# Patient Record
Sex: Male | Born: 1990 | Race: Black or African American | Hispanic: No | Marital: Married | Smoking: Never smoker
Health system: Southern US, Community
[De-identification: ages and names within clinical notes are randomized; demographics above are authoritative.]

## PROBLEM LIST (undated history)

## (undated) DIAGNOSIS — R569 Unspecified convulsions: Secondary | ICD-10-CM

---
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
## 2009-06-24 ENCOUNTER — Emergency Department (HOSPITAL_BASED_OUTPATIENT_CLINIC_OR_DEPARTMENT_OTHER): Admission: EM | Admit: 2009-06-24 | Discharge: 2009-06-25 | Payer: Self-pay | Admitting: Emergency Medicine

## 2010-03-18 ENCOUNTER — Emergency Department (HOSPITAL_BASED_OUTPATIENT_CLINIC_OR_DEPARTMENT_OTHER)
Admission: EM | Admit: 2010-03-18 | Discharge: 2010-03-19 | Disposition: A | Discharge status: Home / Self Care | Payer: Self-pay | Attending: Emergency Medicine | Admitting: Emergency Medicine

## 2010-03-18 DIAGNOSIS — R112 Nausea with vomiting, unspecified: Secondary | ICD-10-CM | POA: Insufficient documentation

## 2010-03-18 DIAGNOSIS — K5289 Other specified noninfective gastroenteritis and colitis: Secondary | ICD-10-CM | POA: Insufficient documentation

## 2010-05-03 LAB — URINALYSIS, ROUTINE W REFLEX MICROSCOPIC
Bilirubin Urine: NEGATIVE
Glucose, UA: NEGATIVE mg/dL
Hgb urine dipstick: NEGATIVE
Ketones, ur: 15 mg/dL — AB
Nitrite: NEGATIVE
Protein, ur: NEGATIVE mg/dL
Specific Gravity, Urine: 1.028 (ref 1.005–1.030)
Urobilinogen, UA: 1 mg/dL (ref 0.0–1.0)
pH: 7.5 (ref 5.0–8.0)

## 2010-05-03 LAB — CBC
HCT: 47.1 % (ref 39.0–52.0)
Hemoglobin: 15.7 g/dL (ref 13.0–17.0)
MCHC: 33.4 g/dL (ref 30.0–36.0)
MCV: 84.1 fL (ref 78.0–100.0)
Platelets: 231 10*3/uL (ref 150–400)
RBC: 5.6 MIL/uL (ref 4.22–5.81)
RDW: 12.8 % (ref 11.5–15.5)
WBC: 9.2 10*3/uL (ref 4.0–10.5)

## 2010-05-03 LAB — URINE CULTURE
Colony Count: NO GROWTH
Culture: NO GROWTH

## 2010-05-03 LAB — DIFFERENTIAL
Basophils Absolute: 0 10*3/uL (ref 0.0–0.1)
Basophils Relative: 0 % (ref 0–1)
Eosinophils Absolute: 0 10*3/uL (ref 0.0–0.7)
Eosinophils Relative: 0 % (ref 0–5)
Lymphocytes Relative: 5 % — ABNORMAL LOW (ref 12–46)
Lymphs Abs: 0.5 10*3/uL — ABNORMAL LOW (ref 0.7–4.0)
Monocytes Absolute: 0.6 10*3/uL (ref 0.1–1.0)
Monocytes Relative: 6 % (ref 3–12)
Neutro Abs: 8.1 10*3/uL — ABNORMAL HIGH (ref 1.7–7.7)
Neutrophils Relative %: 88 % — ABNORMAL HIGH (ref 43–77)

## 2010-05-03 LAB — GC/CHLAMYDIA PROBE AMP, GENITAL
Chlamydia, DNA Probe: NEGATIVE
GC Probe Amp, Genital: NEGATIVE

## 2010-05-03 LAB — URINE MICROSCOPIC-ADD ON

## 2010-05-03 LAB — COMPREHENSIVE METABOLIC PANEL
ALT: 14 U/L (ref 0–53)
AST: 21 U/L (ref 0–37)
Albumin: 4.3 g/dL (ref 3.5–5.2)
Alkaline Phosphatase: 91 U/L (ref 39–117)
BUN: 17 mg/dL (ref 6–23)
CO2: 27 mEq/L (ref 19–32)
Calcium: 9.8 mg/dL (ref 8.4–10.5)
Chloride: 105 mEq/L (ref 96–112)
Creatinine, Ser: 0.9 mg/dL (ref 0.4–1.5)
GFR calc Af Amer: >60 mL/min (ref >60)
GFR calc non Af Amer: >60 mL/min (ref >60)
Glucose, Bld: 126 mg/dL — ABNORMAL HIGH (ref 70–99)
Potassium: 3.6 mEq/L (ref 3.5–5.1)
Sodium: 145 mEq/L (ref 135–145)
Total Bilirubin: 0.6 mg/dL (ref 0.3–1.2)
Total Protein: 7.8 g/dL (ref 6.0–8.3)

## 2010-05-03 LAB — RAPID STREP SCREEN (MED CTR MEBANE ONLY): Streptococcus, Group A Screen (Direct): NEGATIVE

## 2011-09-16 ENCOUNTER — Encounter (HOSPITAL_BASED_OUTPATIENT_CLINIC_OR_DEPARTMENT_OTHER): Payer: Self-pay

## 2011-09-16 ENCOUNTER — Emergency Department (HOSPITAL_BASED_OUTPATIENT_CLINIC_OR_DEPARTMENT_OTHER): Payer: Self-pay

## 2011-09-16 ENCOUNTER — Emergency Department (HOSPITAL_BASED_OUTPATIENT_CLINIC_OR_DEPARTMENT_OTHER)
Admission: EM | Admit: 2011-09-16 | Discharge: 2011-09-17 | Disposition: A | Discharge status: Home / Self Care | Payer: Self-pay | Attending: Emergency Medicine | Admitting: Emergency Medicine

## 2011-09-16 ENCOUNTER — Emergency Department (HOSPITAL_BASED_OUTPATIENT_CLINIC_OR_DEPARTMENT_OTHER)
Admission: EM | Admit: 2011-09-16 | Discharge: 2011-09-16 | Disposition: A | Discharge status: Home / Self Care | Payer: Self-pay | Attending: Emergency Medicine | Admitting: Emergency Medicine

## 2011-09-16 ENCOUNTER — Encounter (HOSPITAL_BASED_OUTPATIENT_CLINIC_OR_DEPARTMENT_OTHER): Payer: Self-pay | Admitting: Emergency Medicine

## 2011-09-16 DIAGNOSIS — K2970 Gastritis, unspecified, without bleeding: Secondary | ICD-10-CM | POA: Insufficient documentation

## 2011-09-16 DIAGNOSIS — R10819 Abdominal tenderness, unspecified site: Secondary | ICD-10-CM | POA: Insufficient documentation

## 2011-09-16 DIAGNOSIS — R109 Unspecified abdominal pain: Secondary | ICD-10-CM | POA: Insufficient documentation

## 2011-09-16 DIAGNOSIS — K2990 Gastroduodenitis, unspecified, without bleeding: Secondary | ICD-10-CM | POA: Insufficient documentation

## 2011-09-16 HISTORY — DX: Unspecified convulsions: R56.9

## 2011-09-16 LAB — URINALYSIS, ROUTINE W REFLEX MICROSCOPIC
Bilirubin Urine: NEGATIVE
Glucose, UA: NEGATIVE mg/dL
Ketones, ur: NEGATIVE mg/dL
Leukocytes, UA: NEGATIVE
Nitrite: NEGATIVE
Protein, ur: NEGATIVE mg/dL
Specific Gravity, Urine: 1.028 (ref 1.005–1.030)
Urobilinogen, UA: 0.2 mg/dL (ref 0.0–1.0)
pH: 6 (ref 5.0–8.0)

## 2011-09-16 LAB — COMPREHENSIVE METABOLIC PANEL
ALT: 40 U/L (ref 0–53)
AST: 25 U/L (ref 0–37)
Albumin: 4.1 g/dL (ref 3.5–5.2)
BUN: 14 mg/dL (ref 6–23)
Calcium: 9.7 mg/dL (ref 8.4–10.5)
Chloride: 104 mEq/L (ref 96–112)
Creatinine, Ser: 0.9 mg/dL (ref 0.50–1.35)
GFR calc Af Amer: >90 mL/min (ref >90)
Glucose, Bld: 125 mg/dL — ABNORMAL HIGH (ref 70–99)
Potassium: 4.4 mEq/L (ref 3.5–5.1)
Sodium: 139 mEq/L (ref 135–145)
Total Bilirubin: 0.5 mg/dL (ref 0.3–1.2)
Total Protein: 7.2 g/dL (ref 6.0–8.3)

## 2011-09-16 LAB — CBC WITH DIFFERENTIAL/PLATELET
Basophils Relative: 0 % (ref 0–1)
Eosinophils Absolute: 0.1 10*3/uL (ref 0.0–0.7)
Hemoglobin: 14.6 g/dL (ref 13.0–17.0)
Lymphocytes Relative: 18 % (ref 12–46)
Lymphs Abs: 1.1 10*3/uL (ref 0.7–4.0)
MCH: 27.8 pg (ref 26.0–34.0)
MCHC: 34 g/dL (ref 30.0–36.0)
MCV: 81.7 fL (ref 78.0–100.0)
Monocytes Absolute: 0.3 10*3/uL (ref 0.1–1.0)
Monocytes Relative: 4 % (ref 3–12)
Neutro Abs: 4.7 10*3/uL (ref 1.7–7.7)
Neutrophils Relative %: 76 % (ref 43–77)
Platelets: 204 10*3/uL (ref 150–400)
RBC: 5.26 MIL/uL (ref 4.22–5.81)
RDW: 13.3 % (ref 11.5–15.5)
WBC: 6.2 10*3/uL (ref 4.0–10.5)

## 2011-09-16 LAB — LIPASE, BLOOD: Lipase: 27 U/L (ref 11–59)

## 2011-09-16 MED ORDER — ONDANSETRON HCL 4 MG/2ML IJ SOLN
4.0000 mg | Freq: Once | INTRAMUSCULAR | Status: AC
  Administered 2011-09-16: 4 mg via INTRAVENOUS
  Filled 2011-09-16: qty 2

## 2011-09-16 MED ORDER — RANITIDINE HCL 150 MG PO TABS: 150.0000 mg | ORAL_TABLET | Freq: Two times a day (BID) | ORAL | Status: DC

## 2011-09-16 MED ORDER — IOHEXOL 300 MG/ML  SOLN
100.0000 mL | Freq: Once | INTRAMUSCULAR | Status: AC | PRN
  Administered 2011-09-16: 100 mL via INTRAVENOUS

## 2011-09-16 MED ORDER — MORPHINE SULFATE 4 MG/ML IJ SOLN
4.0000 mg | Freq: Once | INTRAMUSCULAR | Status: AC
  Administered 2011-09-16: 4 mg via INTRAVENOUS
  Filled 2011-09-16: qty 1

## 2011-09-16 MED ORDER — GI COCKTAIL ~~LOC~~
30.0000 mL | Freq: Once | ORAL | Status: AC
  Administered 2011-09-16: 30 mL via ORAL
  Filled 2011-09-16: qty 30

## 2011-09-16 MED ORDER — IOHEXOL 300 MG/ML  SOLN
40.0000 mL | Freq: Once | INTRAMUSCULAR | Status: AC | PRN
  Administered 2011-09-16: 40 mL via ORAL

## 2011-09-16 MED ORDER — ALUM & MAG HYDROXIDE-SIMETH 200-200-20 MG/5ML PO SUSP: ORAL | Status: DC

## 2011-09-16 NOTE — ED Notes (Signed)
Pt c/o LUQ & LT flank pain x 2 days

## 2011-09-16 NOTE — ED Provider Notes (Signed)
History     CSN: [id number]  Arrival date & time 09/16/11  2035   First MD Initiated Contact with Patient 09/16/11 2050      Chief Complaint  Patient presents with  . Abdominal Pain    (Consider location/radiation/quality/duration/timing/severity/associated sxs/prior treatment) HPI Comments: Pt states that he was seen earlier today, but the symptoms are getting worse:pt states that he is now having vomiting and diffuse pain on the entire left abdomen  Patient is a 21 y.o. male presenting with abdominal pain. The history is provided by the patient. No language interpreter was used.  Abdominal Pain The primary symptoms of the illness include abdominal pain, nausea and vomiting. The primary symptoms of the illness do not include fever or diarrhea. The current episode started 2 days ago. The onset of the illness was gradual. The problem has not changed since onset. The patient states that she believes she is currently not pregnant. The patient has not had a change in bowel habit.    Past Medical History  Diagnosis Date  . Seizures     History reviewed. No pertinent past surgical history.  No family history on file.  History  Substance Use Topics  . Smoking status: Never Smoker   . Smokeless tobacco: Not on file  . Alcohol Use: Yes     social      Review of Systems  Constitutional: Negative for fever.  Respiratory: Negative.   Cardiovascular: Negative.   Gastrointestinal: Positive for nausea, vomiting and abdominal pain. Negative for diarrhea.  Neurological: Negative.     Allergies  Lamictal  Home Medications   Current Outpatient Rx  Name Route Sig Dispense Refill  . ALUM & MAG HYDROXIDE-SIMETH 200-200-20 MG/5ML PO SUSP  Take 2 teaspoons of Mylanta after meals and at bedtime.  Buy over-the-counter. 355 mL 0  . RANITIDINE HCL 150 MG PO TABS Oral Take 1 tablet (150 mg total) by mouth 2 (two) times daily. 30 tablet 0    BP 133/87  Pulse 65  Temp 97.8 F (36.6 C)  (Oral)  Resp 14  Ht 5\' 5"  (1.651 m)  Wt 140 lb (63.504 kg)  BMI 23.30 kg/m2  SpO2 100%  Physical Exam  Nursing note and vitals reviewed. Constitutional: He appears well-developed and well-nourished.  HENT:  Head: Normocephalic and atraumatic.  Eyes: Conjunctivae and EOM are normal.  Neck: Neck supple.  Cardiovascular: Normal rate and regular rhythm.   Pulmonary/Chest: Effort normal and breath sounds normal.  Abdominal: Soft. Bowel sounds are normal. There is tenderness in the left upper quadrant and left lower quadrant.  Musculoskeletal: Normal range of motion.  Neurological: He is alert.  Skin: Skin is warm and dry.    ED Course  Procedures (including critical care time)  Labs Reviewed - No data to display Ct Abdomen Pelvis W Contrast  09/16/2011  *RADIOLOGY REPORT*  Clinical Data: Left-sided abdominal pain and vomiting.  CT ABDOMEN AND PELVIS WITH CONTRAST  Technique:  Multidetector CT imaging of the abdomen and pelvis was performed following the standard protocol during bolus administration of intravenous contrast.  Contrast: OMNIPAQUE IOHEXOL 300 MG/ML  SOLN  Comparison: None.  Findings: The lung bases are clear.  The liver, spleen, gallbladder, pancreas, adrenal glands, kidneys, abdominal aorta, and retroperitoneal lymph nodes are unremarkable.  The stomach, small bowel, and colon are not abnormally distended and no obvious wall thickening is appreciated.  No free air or free fluid in the abdomen.  Pelvis:  Prostate gland is not enlarged.  Bladder wall is not thickened.  No free or loculated pelvic fluid collections. Appendix is not identified but no focal right lower quadrant inflammatory changes are present.  No significant pelvic lymphadenopathy.  Normal alignment of the lumbar vertebrae.  IMPRESSION: No acute process demonstrated in the abdomen or pelvis.  Original Report Authenticated By: Marlon Pel, M.D.   Dg Abd Acute W/chest  09/16/2011  *RADIOLOGY REPORT*   Clinical Data: Abdominal pain and flank pain  ACUTE ABDOMEN SERIES (ABDOMEN 2 VIEW & CHEST 1 VIEW)  Comparison: None  Findings: The heart size and mediastinal contours are within normal limits.  Both lungs are clear.  The visualized skeletal structures are unremarkable.  The bowel gas pattern appears nonobstructed.  There are no dilated loops of small bowel or air-fluid levels identified.  No abnormal abdominal or pelvic calcifications identified.  IMPRESSION:  1.  Normal exam.  Original Report Authenticated By: Rosealee Albee, M.D.     1. Gastritis       MDM  Pt is okay to follow up with gi:pt is comfortable at this time        Teressa Lower, NP 09/16/11 2351

## 2011-09-16 NOTE — ED Provider Notes (Signed)
History     CSN: [id number]  Arrival date & time 09/16/11  0825   First MD Initiated Contact with Patient 09/16/11 0848      Chief Complaint  Patient presents with  . Abdominal Pain  . Flank Pain    (Consider location/radiation/quality/duration/timing/severity/associated sxs/prior treatment) HPI Comments: The patient is a 21 year old man who says that for the past few nights he said pain in his abdomen. The pain is located to the left upper abdominal quadrant. It seems to have gotten worse, and he was up all last night with pain. He denies vomiting or diarrhea. There's been no dysuria. He denies recent injury. He was up all night with the pain, but took no medication.  Patient is a 21 y.o. male presenting with abdominal pain and flank pain.  Abdominal Pain The primary symptoms of the illness include abdominal pain and diarrhea. The primary symptoms of the illness do not include nausea, vomiting or dysuria. The current episode started 2 days ago. The onset of the illness was gradual. The problem has been gradually worsening.  The abdominal pain began 2 days ago. The pain came on gradually. The abdominal pain has been gradually worsening since its onset. The abdominal pain is located in the LUQ. The abdominal pain does not radiate. The severity of the abdominal pain is 8/10. The abdominal pain is relieved by nothing. Exacerbated by: Nothing.  Associated with: Nothing. The patient has not had a change in bowel habit. Symptoms associated with the illness do not include chills, constipation, hematuria, frequency or back pain.  Flank Pain Associated symptoms include abdominal pain.    Past Medical History  Diagnosis Date  . Seizures     History reviewed. No pertinent past surgical history.  No family history on file.  History  Substance Use Topics  . Smoking status: Never Smoker   . Smokeless tobacco: Not on file  . Alcohol Use: Yes     social      Review of Systems    Constitutional: Negative for chills.  HENT: Negative.   Eyes: Negative.   Respiratory: Negative.   Cardiovascular: Negative.   Gastrointestinal: Positive for abdominal pain and diarrhea. Negative for nausea, vomiting and constipation.  Genitourinary: Positive for flank pain. Negative for dysuria, frequency and hematuria.  Musculoskeletal: Negative.  Negative for back pain.  Skin: Negative.   Neurological: Negative.   Psychiatric/Behavioral: Negative.     Allergies  Lamictal  Home Medications  No current outpatient prescriptions on file.  Pulse 65  Temp 97.7 F (36.5 C)  Resp 16  Ht 5\' 5"  (1.651 m)  Wt 140 lb (63.504 kg)  BMI 23.30 kg/m2  SpO2 100%  Physical Exam  Nursing note and vitals reviewed. Constitutional: He is oriented to person, place, and time. He appears well-developed and well-nourished. No distress.  HENT:  Head: Normocephalic and atraumatic.  Right Ear: External ear normal.  Left Ear: External ear normal.  Mouth/Throat: Oropharynx is clear and moist.  Neck: Normal range of motion. No tracheal deviation present.  Cardiovascular: Normal rate, regular rhythm and normal heart sounds.   Pulmonary/Chest: Effort normal and breath sounds normal.  Abdominal: Soft. Bowel sounds are normal.       He localizes pain to the left upper abdomen.  There is mild-moderate tenderness there.  Musculoskeletal: Normal range of motion. He exhibits no tenderness.  Neurological: He is alert and oriented to person, place, and time.       No sensory or motor deficit.  Skin: Skin is warm and dry.  Psychiatric: He has a normal mood and affect. His behavior is normal.    ED Course  Procedures (including critical care time)   Labs Reviewed  URINALYSIS, ROUTINE W REFLEX MICROSCOPIC  COMPREHENSIVE METABOLIC PANEL  LIPASE, BLOOD  URINALYSIS, ROUTINE W REFLEX MICROSCOPIC  CBC WITH DIFFERENTIAL   8:58 AM Patient was seen and had physical examination. Lab tests were ordered. A  GI cocktail was ordered.  10:16 AM Results for orders placed during the hospital encounter of 09/16/11  URINALYSIS, ROUTINE W REFLEX MICROSCOPIC      Component Value Range   Color, Urine YELLOW  YELLOW   APPearance CLEAR  CLEAR   Specific Gravity, Urine 1.028  1.005 - 1.030   pH 6.0  5.0 - 8.0   Glucose, UA NEGATIVE  NEGATIVE mg/dL   Hgb urine dipstick NEGATIVE  NEGATIVE   Bilirubin Urine NEGATIVE  NEGATIVE   Ketones, ur NEGATIVE  NEGATIVE mg/dL   Protein, ur NEGATIVE  NEGATIVE mg/dL   Urobilinogen, UA 0.2  0.0 - 1.0 mg/dL   Nitrite NEGATIVE  NEGATIVE   Leukocytes, UA NEGATIVE  NEGATIVE  COMPREHENSIVE METABOLIC PANEL      Component Value Range   Sodium 139  135 - 145 mEq/L   Potassium 4.4  3.5 - 5.1 mEq/L   Chloride 104  96 - 112 mEq/L   CO2 29  19 - 32 mEq/L   Glucose, Bld 125 (*) 70 - 99 mg/dL   BUN 14  6 - 23 mg/dL   Creatinine, Ser 8.29  0.50 - 1.35 mg/dL   Calcium 9.7  8.4 - 56.2 mg/dL   Total Protein 7.2  6.0 - 8.3 g/dL   Albumin 4.1  3.5 - 5.2 g/dL   AST 25  0 - 37 U/L   ALT 40  0 - 53 U/L   Alkaline Phosphatase 57  39 - 117 U/L   Total Bilirubin 0.5  0.3 - 1.2 mg/dL   GFR calc non Af Amer >90  >90 mL/min   GFR calc Af Amer >90  >90 mL/min  LIPASE, BLOOD      Component Value Range   Lipase 27  11 - 59 U/L  CBC WITH DIFFERENTIAL      Component Value Range   WBC 6.2  4.0 - 10.5 K/uL   RBC 5.26  4.22 - 5.81 MIL/uL   Hemoglobin 14.6  13.0 - 17.0 g/dL   HCT 13.0  86.5 - 78.4 %   MCV 81.7  78.0 - 100.0 fL   MCH 27.8  26.0 - 34.0 pg   MCHC 34.0  30.0 - 36.0 g/dL   RDW 69.6  29.5 - 28.4 %   Platelets 204  150 - 400 K/uL   Neutrophils Relative 76  43 - 77 %   Neutro Abs 4.7  1.7 - 7.7 K/uL   Lymphocytes Relative 18  12 - 46 %   Lymphs Abs 1.1  0.7 - 4.0 K/uL   Monocytes Relative 4  3 - 12 %   Monocytes Absolute 0.3  0.1 - 1.0 K/uL   Eosinophils Relative 1  0 - 5 %   Eosinophils Absolute 0.1  0.0 - 0.7 K/uL   Basophils Relative 0  0 - 1 %   Basophils Absolute  0.0  0.0 - 0.1 K/uL   Dg Abd Acute W/chest  09/16/2011  *RADIOLOGY REPORT*  Clinical Data: Abdominal pain and flank pain  ACUTE ABDOMEN SERIES (ABDOMEN  2 VIEW & CHEST 1 VIEW)  Comparison: None  Findings: The heart size and mediastinal contours are within normal limits.  Both lungs are clear.  The visualized skeletal structures are unremarkable.  The bowel gas pattern appears nonobstructed.  There are no dilated loops of small bowel or air-fluid levels identified.  No abnormal abdominal or pelvic calcifications identified.  IMPRESSION:  1.  Normal exam.  Original Report Authenticated By: Rosealee Albee, M.D.    Lab workup is negative.  Advised ranitidine 150 mg bid, antacids pc and hs.  1. Gastritis          Carleene Cooper III, MD 09/16/11 1019

## 2011-09-16 NOTE — ED Notes (Signed)
Patient reports that was seen earlier today and was discharged after getting relief with GI cocktail. Reports that after eating vomited and continuing to left sided abdominal pain. No relief with meds prescribed. Denies diarrhea

## 2011-09-17 NOTE — ED Provider Notes (Signed)
History/physical exam/procedure(s) were performed by non-physician practitioner and as supervising physician I was immediately available for consultation/collaboration. I have reviewed all notes and am in agreement with care and plan.   Quaneshia Wareing S Zykeria Laguardia, MD 09/17/11 1614 

## 2014-02-04 ENCOUNTER — Emergency Department (HOSPITAL_BASED_OUTPATIENT_CLINIC_OR_DEPARTMENT_OTHER)
Admission: EM | Admit: 2014-02-04 | Discharge: 2014-02-04 | Disposition: A | Discharge status: Home / Self Care | Payer: Self-pay | Attending: Emergency Medicine | Admitting: Emergency Medicine

## 2014-02-04 ENCOUNTER — Encounter (HOSPITAL_BASED_OUTPATIENT_CLINIC_OR_DEPARTMENT_OTHER): Payer: Self-pay | Admitting: *Deleted

## 2014-02-04 ENCOUNTER — Emergency Department (HOSPITAL_BASED_OUTPATIENT_CLINIC_OR_DEPARTMENT_OTHER): Payer: Self-pay

## 2014-02-04 DIAGNOSIS — Z79899 Other long term (current) drug therapy: Secondary | ICD-10-CM | POA: Insufficient documentation

## 2014-02-04 DIAGNOSIS — Z8669 Personal history of other diseases of the nervous system and sense organs: Secondary | ICD-10-CM | POA: Insufficient documentation

## 2014-02-04 DIAGNOSIS — Y929 Unspecified place or not applicable: Secondary | ICD-10-CM | POA: Insufficient documentation

## 2014-02-04 DIAGNOSIS — Y9389 Activity, other specified: Secondary | ICD-10-CM | POA: Insufficient documentation

## 2014-02-04 DIAGNOSIS — W19XXXA Unspecified fall, initial encounter: Secondary | ICD-10-CM

## 2014-02-04 DIAGNOSIS — X58XXXA Exposure to other specified factors, initial encounter: Secondary | ICD-10-CM | POA: Insufficient documentation

## 2014-02-04 DIAGNOSIS — S93402A Sprain of unspecified ligament of left ankle, initial encounter: Secondary | ICD-10-CM | POA: Insufficient documentation

## 2014-02-04 DIAGNOSIS — Y998 Other external cause status: Secondary | ICD-10-CM | POA: Insufficient documentation

## 2014-02-04 NOTE — ED Notes (Signed)
Pt c/o left ankle injury x 1 week ago

## 2014-02-04 NOTE — ED Notes (Signed)
Pt ambulating independently w/ steady gait on d/c in no acute distress, A&Ox4. D/c instructions reviewed w/ pt - pt denies any further questions or concerns at present.  

## 2014-02-04 NOTE — Discharge Instructions (Signed)
Rest, ice and elevate your ankle. You may take ibuprofen, 600-800 mg every 6-8 hours as needed for pain.  Ankle Sprain An ankle sprain is an injury to the strong, fibrous tissues (ligaments) that hold the bones of your ankle joint together.  CAUSES An ankle sprain is usually caused by a fall or by twisting your ankle. Ankle sprains most commonly occur when you step on the outer edge of your foot, and your ankle turns inward. People who participate in sports are more prone to these types of injuries.  SYMPTOMS   Pain in your ankle. The pain may be present at rest or only when you are trying to stand or walk.  Swelling.  Bruising. Bruising may develop immediately or within 1 to 2 days after your injury.  Difficulty standing or walking, particularly when turning corners or changing directions. DIAGNOSIS  Your caregiver will ask you details about your injury and perform a physical exam of your ankle to determine if you have an ankle sprain. During the physical exam, your caregiver will press on and apply pressure to specific areas of your foot and ankle. Your caregiver will try to move your ankle in certain ways. An X-ray exam may be done to be sure a bone was not broken or a ligament did not separate from one of the bones in your ankle (avulsion fracture).  TREATMENT  Certain types of braces can help stabilize your ankle. Your caregiver can make a recommendation for this. Your caregiver may recommend the use of medicine for pain. If your sprain is severe, your caregiver may refer you to a surgeon who helps to restore function to parts of your skeletal system (orthopedist) or a physical therapist. HOME CARE INSTRUCTIONS   Apply ice to your injury for 1-2 days or as directed by your caregiver. Applying ice helps to reduce inflammation and pain.  Put ice in a plastic bag.  Place a towel between your skin and the bag.  Leave the ice on for 15-20 minutes at a time, every 2 hours while you are  awake.  Only take over-the-counter or prescription medicines for pain, discomfort, or fever as directed by your caregiver.  Elevate your injured ankle above the level of your heart as much as possible for 2-3 days.  If your caregiver recommends crutches, use them as instructed. Gradually put weight on the affected ankle. Continue to use crutches or a cane until you can walk without feeling pain in your ankle.  If you have a plaster splint, wear the splint as directed by your caregiver. Do not rest it on anything harder than a pillow for the first 24 hours. Do not put weight on it. Do not get it wet. You may take it off to take a shower or bath.  You may have been given an elastic bandage to wear around your ankle to provide support. If the elastic bandage is too tight (you have numbness or tingling in your foot or your foot becomes cold and blue), adjust the bandage to make it comfortable.  If you have an air splint, you may blow more air into it or let air out to make it more comfortable. You may take your splint off at night and before taking a shower or bath. Wiggle your toes in the splint several times per day to decrease swelling. SEEK MEDICAL CARE IF:   You have rapidly increasing bruising or swelling.  Your toes feel extremely cold or you lose feeling in your foot.  Your pain is not relieved with medicine. SEEK IMMEDIATE MEDICAL CARE IF:  Your toes are numb or blue.  You have severe pain that is increasing. MAKE SURE YOU:   Understand these instructions.  Will watch your condition.  Will get help right away if you are not doing well or get worse. Document Released: 01/30/2005 Document Revised: 10/25/2011 Document Reviewed: 02/11/2011 [facility] LPExitCare Patient Information 2015 [place], [place]. This information is not intended to replace advice given to you by your health care provider. Make sure you discuss any questions you have with your health care provider. RICE: Routine Care for  Injuries The routine care of many injuries includes Rest, Ice, Compression, and Elevation (RICE). HOME CARE INSTRUCTIONS  Rest is needed to allow your body to heal. Routine activities can usually be resumed when comfortable. Injured tendons and bones can take up to 6 weeks to heal. Tendons are the cord-like structures that attach muscle to bone.  Ice following an injury helps keep the swelling down and reduces pain.  Put ice in a plastic bag.  Place a towel between your skin and the bag.  Leave the ice on for 15-20 minutes, 3-4 times a day, or as directed by your health care provider. Do this while awake, for the first 24 to 48 hours. After that, continue as directed by your caregiver.  Compression helps keep swelling down. It also gives support and helps with discomfort. If an elastic bandage has been applied, it should be removed and reapplied every 3 to 4 hours. It should not be applied tightly, but firmly enough to keep swelling down. Watch fingers or toes for swelling, bluish discoloration, coldness, numbness, or excessive pain. If any of these problems occur, remove the bandage and reapply loosely. Contact your caregiver if these problems continue.  Elevation helps reduce swelling and decreases pain. With extremities, such as the arms, hands, legs, and feet, the injured area should be placed near or above the level of the heart, if possible. SEEK IMMEDIATE MEDICAL CARE IF:  You have persistent pain and swelling.  You develop redness, numbness, or unexpected weakness.  Your symptoms are getting worse rather than improving after several days. These symptoms may indicate that further evaluation or further X-rays are needed. Sometimes, X-rays may not show a small broken bone (fracture) until 1 week or 10 days later. Make a follow-up appointment with your caregiver. Ask when your X-ray results will be ready. Make sure you get your X-ray results. Document Released: 05/14/2000 Document  Revised: 02/04/2013 Document Reviewed: 07/01/2010 [facility]Exit[facility] Patient Information 2015 [place], [place]. This information is not intended to replace advice given to you by your health care provider. Make sure you discuss any questions you have with your health care provider.

## 2014-02-04 NOTE — ED Provider Notes (Signed)
CSN: [id number]     Arrival date & time 02/04/14  1929 History   First MD Initiated Contact with Patient 02/04/14 2018     Chief Complaint  Patient presents with  . Ankle Pain     (Consider location/radiation/quality/duration/timing/severity/associated sxs/prior Treatment) HPI Comments: 23 year old male presenting with left ankle pain 1 week. Patient reports he was at work when he twisted his ankle. He states it was swollen that night, with pain radiating up his leg. States over the past few days the pain has started to subside, and currently is not causing him any problems. States the swelling has significantly gone down. States his wife made him come to the emergency department because it was still swollen. Denies any numbness or tingling. No aggravating or alleviating factors.  Patient is a 23 y.o. male presenting with ankle pain. The history is provided by the patient.  Ankle Pain   Past Medical History  Diagnosis Date  . Seizures    History reviewed. No pertinent past surgical history. History reviewed. No pertinent family history. History  Substance Use Topics  . Smoking status: Never Smoker   . Smokeless tobacco: Not on file  . Alcohol Use: Yes     Comment: social    Review of Systems  Constitutional: Negative.   HENT: Negative.   Respiratory: Negative.   Cardiovascular: Negative.   Gastrointestinal: Negative for nausea.  Musculoskeletal:       + L ankle pain and swelling.  Skin: Negative.   Neurological: Negative for numbness.      Allergies  Lamictal  Home Medications   Prior to Admission medications   Medication Sig Start Date End Date Taking? Authorizing Provider  alum & mag hydroxide-simeth (MAALOX/MYLANTA) 200-200-20 MG/5ML suspension Take 10 mLs by mouth daily as needed. Take 2 teaspoons of Mylanta after meals and at bedtime.  Buy over-the-counter. 09/16/11   Carleene CooperAlan Davidson, MD  ranitidine (ZANTAC) 150 MG tablet Take 1 tablet (150 mg total) by mouth 2  (two) times daily. 09/16/11 09/15/12  Carleene CooperAlan Davidson, MD   BP 138/70 mmHg  Pulse 89  Temp(Src) 97.8 F (36.6 C) (Oral)  Resp 16  Ht 5\' 6"  (1.676 m)  Wt 170 lb (77.111 kg)  BMI 27.45 kg/m2  SpO2 100% Physical Exam  Constitutional: He is oriented to person, place, and time. He appears well-developed and well-nourished. No distress.  HENT:  Head: Normocephalic and atraumatic.  Eyes: Conjunctivae and EOM are normal.  Neck: Normal range of motion. Neck supple.  Cardiovascular: Normal rate, regular rhythm, normal heart sounds and intact distal pulses.   Pulmonary/Chest: Effort normal and breath sounds normal.  Musculoskeletal: Normal range of motion.  L ankle TTP over AITFL with mild swelling laterally. FROM. No deformity. Achilles tendon normal. No tenderness of proximal fibula. Normal gait.  Neurological: He is alert and oriented to person, place, and time.  Skin: Skin is warm and dry.  Psychiatric: He has a normal mood and affect. His behavior is normal.  Nursing note and vitals reviewed.   ED Course  Procedures (including critical care time) Labs Review Labs Reviewed - No data to display  Imaging Review Dg Ankle Complete Left  02/04/2014   CLINICAL DATA:  Twisting injury 1 week ago, lateral malleolus pain.  EXAM: LEFT ANKLE COMPLETE - 3+ VIEW  COMPARISON:  None.  FINDINGS: No fracture deformity nor dislocation. The ankle mortise appears congruent and the tibiofibular syndesmosis intact. No destructive bony lesions. Lateral ankle soft tissue swelling without subcutaneous gas or  radiopaque foreign bodies.  IMPRESSION: Soft tissue swelling without fracture deformity or dislocation.   Electronically Signed   By: Awilda Metroourtnay  Bloomer   On: 02/04/2014 20:43     EKG Interpretation None      MDM   Final diagnoses:  Left ankle sprain, initial encounter   Neurovascularly intact. Xray without any acute finding. FROM. Normal gait. Advised RICE, NSAIDs. Stable for d/c. Return precautions  given. Patient states understanding of treatment care plan and is agreeable.  Kathrynn SpeedRobyn M London Tarnowski, PA-C 02/04/14 2122  Audree CamelScott T Goldston, MD 02/12/14 [phone]

## 2015-11-03 IMAGING — CR DG ANKLE COMPLETE 3+V*L*
3 series · 3 of 3 positions shown · non-contrast
Comparison: None.

CLINICAL DATA: Twisting injury 1 week ago, lateral malleolus pain.

EXAM:
LEFT ANKLE COMPLETE - 3+ VIEW

[t ankle joint ap left]
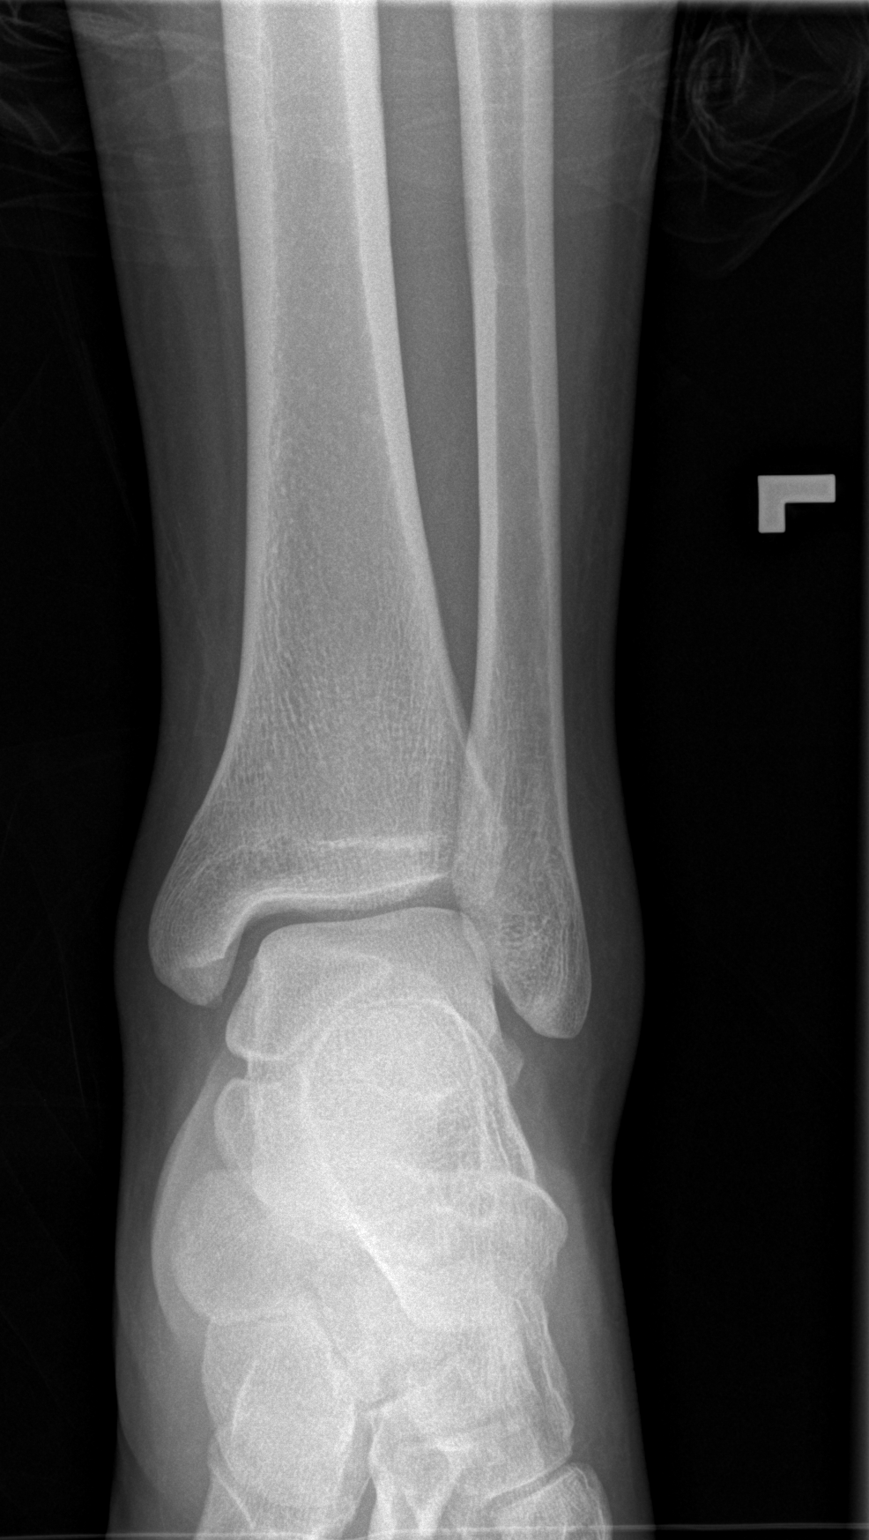

[t ankle joint oblique left]
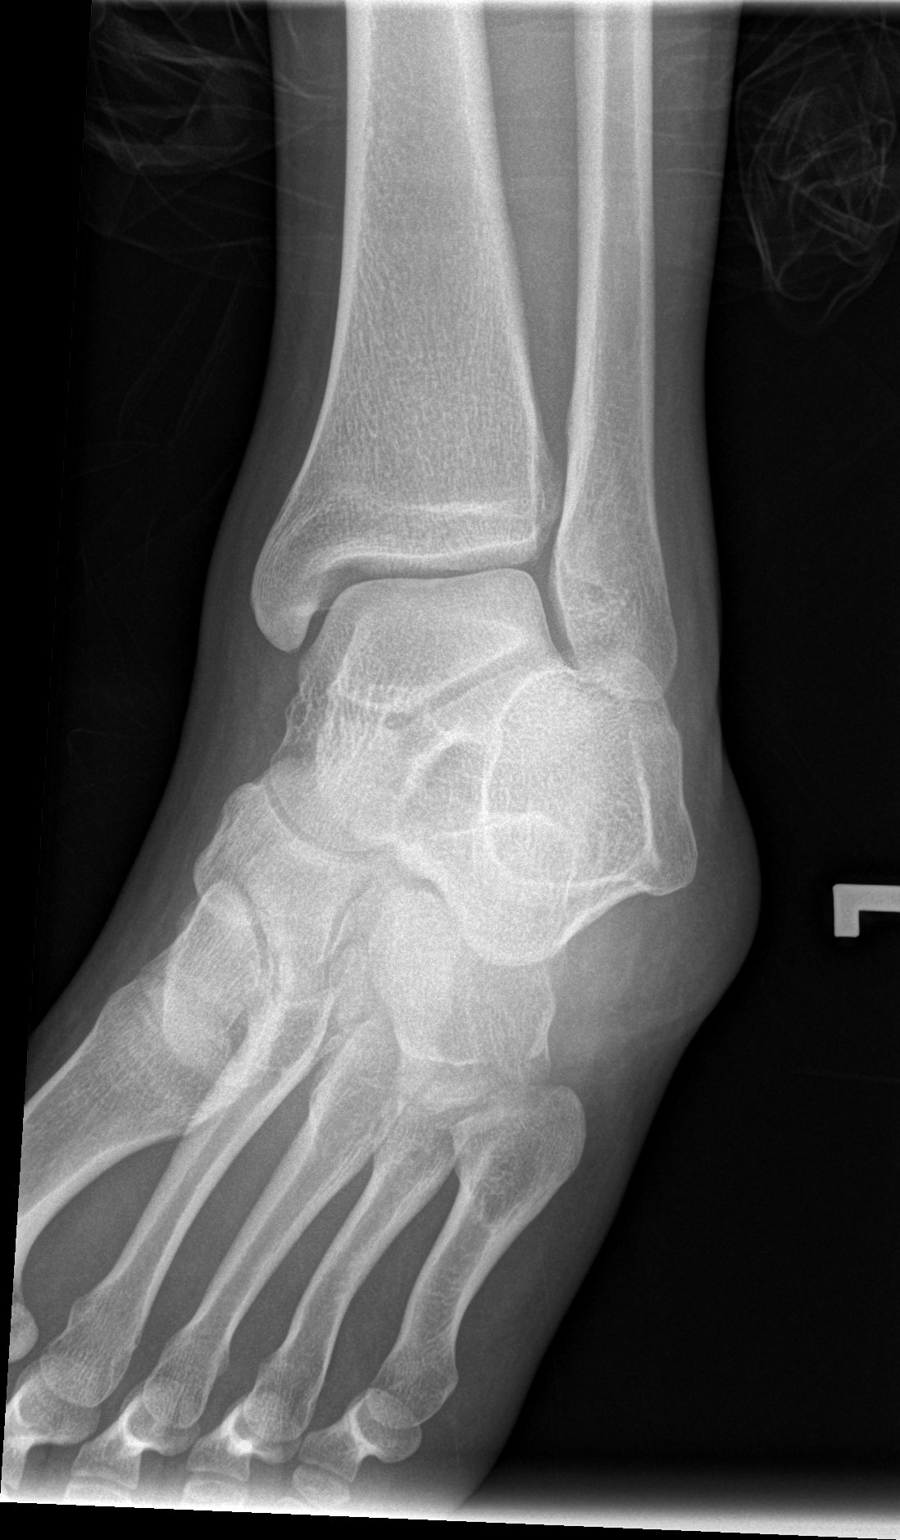

[t ankle joint lat left]
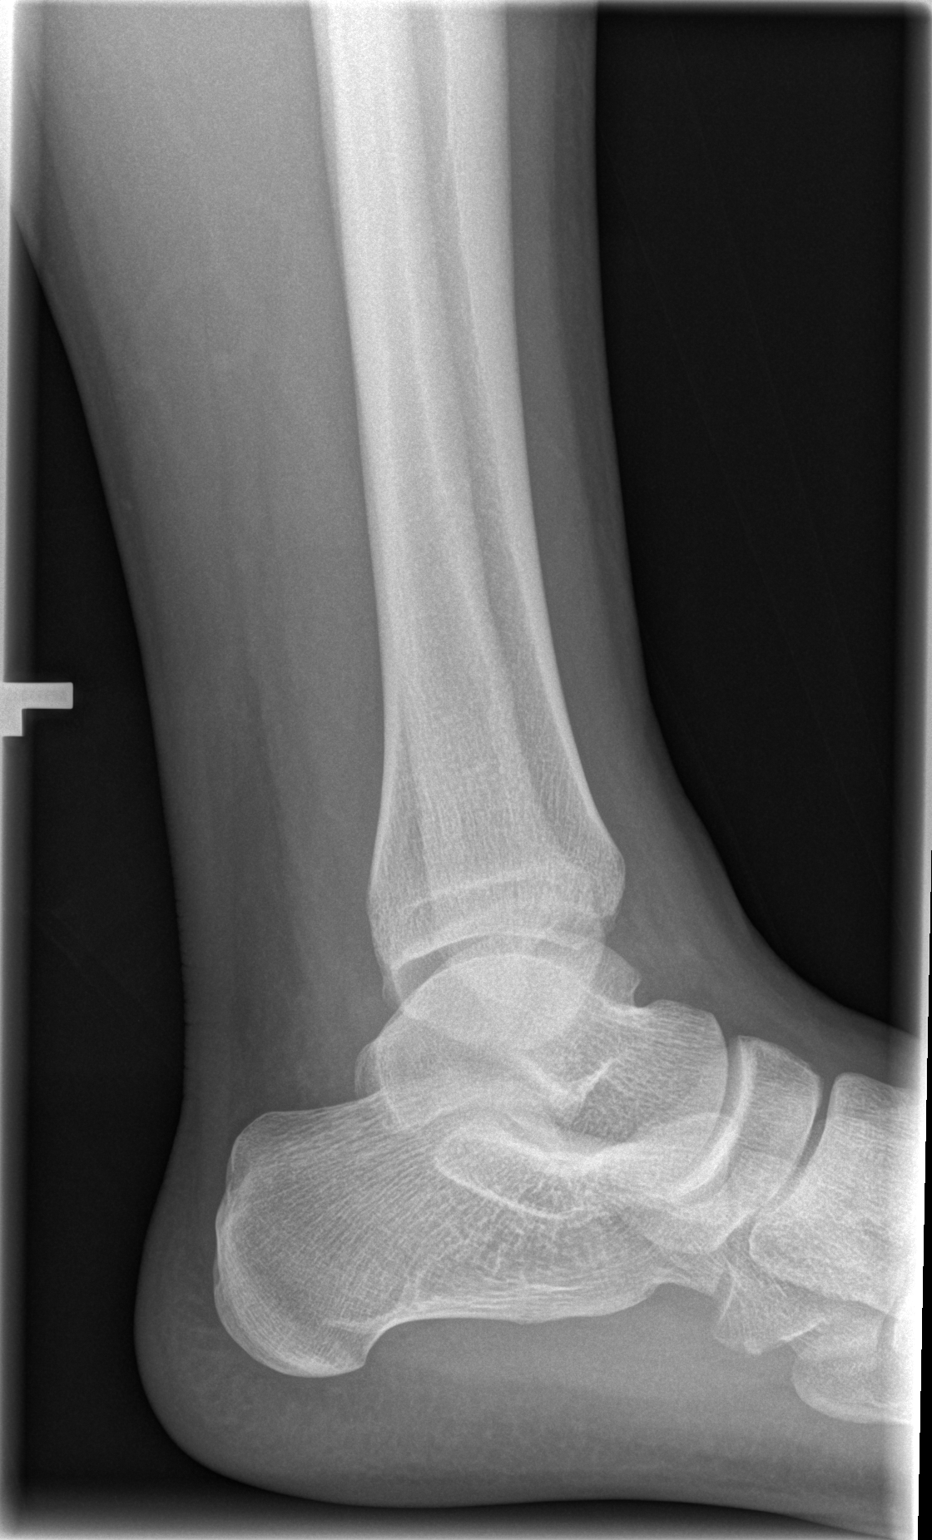

[3 of 3 positions shown; findings below may reference images not displayed]

FINDINGS: No fracture deformity nor dislocation. The ankle mortise appears
congruent and the tibiofibular syndesmosis intact. No destructive
bony lesions. Lateral ankle soft tissue swelling without
subcutaneous gas or radiopaque foreign bodies.
IMPRESSION: Soft tissue swelling without fracture deformity or dislocation.

  By: Polin Billiot

## 2016-09-28 ENCOUNTER — Emergency Department (HOSPITAL_BASED_OUTPATIENT_CLINIC_OR_DEPARTMENT_OTHER)
Admission: EM | Admit: 2016-09-28 | Discharge: 2016-09-28 | Disposition: A | Discharge status: Home / Self Care | Payer: Self-pay | Attending: Emergency Medicine | Admitting: Emergency Medicine

## 2016-09-28 ENCOUNTER — Encounter (HOSPITAL_BASED_OUTPATIENT_CLINIC_OR_DEPARTMENT_OTHER): Payer: Self-pay

## 2016-09-28 DIAGNOSIS — R600 Localized edema: Secondary | ICD-10-CM | POA: Insufficient documentation

## 2016-09-28 DIAGNOSIS — Z5321 Procedure and treatment not carried out due to patient leaving prior to being seen by health care provider: Secondary | ICD-10-CM | POA: Insufficient documentation

## 2016-09-28 NOTE — ED Provider Notes (Cosign Needed)
Went to evaluate pt and he had left before being seen.    Rise MuLeaphart, Steel Kerney T, PA-C 09/28/16 1132

## 2016-09-28 NOTE — ED Notes (Signed)
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 Pt leaves ed amb with quick steady gait.

## 2016-09-28 NOTE — ED Triage Notes (Addendum)
Pt states he was stung by wasp at work yesterday-swelling noted to right side of face and right eyelid is closed with swelling-NAD-steady gait-no paper work sent from-advised to call supervisor [facility]-pt states he was advised by "work phone doctor" to come to ED

## 2022-08-02 ENCOUNTER — Other Ambulatory Visit: Payer: Self-pay

## 2022-08-02 ENCOUNTER — Encounter (HOSPITAL_BASED_OUTPATIENT_CLINIC_OR_DEPARTMENT_OTHER): Payer: Self-pay

## 2022-08-02 ENCOUNTER — Emergency Department (HOSPITAL_BASED_OUTPATIENT_CLINIC_OR_DEPARTMENT_OTHER)
Admission: EM | Admit: 2022-08-02 | Discharge: 2022-08-02 | Disposition: A | Discharge status: Home / Self Care | Payer: Self-pay | Attending: Emergency Medicine | Admitting: Emergency Medicine

## 2022-08-02 DIAGNOSIS — J343 Hypertrophy of nasal turbinates: Secondary | ICD-10-CM | POA: Insufficient documentation

## 2022-08-02 DIAGNOSIS — J339 Nasal polyp, unspecified: Secondary | ICD-10-CM | POA: Insufficient documentation

## 2022-08-02 DIAGNOSIS — R0981 Nasal congestion: Secondary | ICD-10-CM

## 2022-08-02 MED ORDER — FLUTICASONE PROPIONATE 50 MCG/ACT NA SUSP: 1.0000 | Freq: Every day | NASAL | 0 refills | Status: DC

## 2022-08-02 MED ORDER — IBUPROFEN 600 MG PO TABS: 600.0000 mg | ORAL_TABLET | Freq: Four times a day (QID) | ORAL | 0 refills | Status: AC | PRN

## 2022-08-02 MED ORDER — FLUTICASONE PROPIONATE 50 MCG/ACT NA SUSP: 1.0000 | Freq: Two times a day (BID) | NASAL | 0 refills | Status: AC

## 2022-08-02 MED ORDER — ACETAMINOPHEN 325 MG PO TABS: 650.0000 mg | ORAL_TABLET | Freq: Four times a day (QID) | ORAL | 0 refills | Status: AC | PRN

## 2022-08-02 MED ORDER — PSEUDOEPHEDRINE HCL ER 120 MG PO TB12: 120.0000 mg | ORAL_TABLET | Freq: Two times a day (BID) | ORAL | 0 refills | Status: AC

## 2022-08-02 MED ORDER — PREDNISONE 10 MG (21) PO TBPK: ORAL_TABLET | Freq: Every day | ORAL | 0 refills | Status: AC

## 2022-08-02 MED ORDER — CETIRIZINE HCL 10 MG PO TABS: 10.0000 mg | ORAL_TABLET | Freq: Every day | ORAL | 0 refills | Status: AC

## 2022-08-02 NOTE — ED Provider Notes (Signed)
[facility] Provider Note  CSN: [id number] Arrival date & time: 08/02/22 0865  Chief Complaint(s) Facial Pain  HPI Jonathan Brennan is a 32 y.o. male with past medical history as below, significant for seizure, recurrent sinus problems who presents to the ED with complaint of sinus pain/pressure. He reports chronic abnormalities to his sinuses/pressure/congestion. Does not see ENT or PCP regularly. Has tried using netty pot/sinus rinse at home without much relief. Felt increased pressure last night/early this morning and presented to ED for check. No vision or hearing changes, no dysphagia, no pain with jaw movement, no pain with eye movement or vision changes. +similar symptoms previously. No fever or chills, no facial trauma recently   Past Medical History Past Medical History:  Diagnosis Date   Seizures (HCC)    There are no problems to display for this patient.  Home Medication(s) Prior to Admission medications   Medication Sig Start Date End Date Taking? Authorizing Provider  acetaminophen (TYLENOL) 325 MG tablet Take 2 tablets (650 mg total) by mouth every 6 (six) hours as needed. 08/02/22  Yes Sloan Leiter, DO  cetirizine (ZYRTEC ALLERGY) 10 MG tablet Take 1 tablet (10 mg total) by mouth daily for 14 days. 08/02/22 08/16/22 Yes Tanda Rockers A, DO  ibuprofen (ADVIL) 600 MG tablet Take 1 tablet (600 mg total) by mouth every 6 (six) hours as needed. 08/02/22  Yes Tanda Rockers A, DO  predniSONE (STERAPRED UNI-PAK 21 TAB) 10 MG (21) TBPK tablet Take by mouth daily. Take 6 tabs by mouth daily  for 2 days, then 5 tabs for 2 days, then 4 tabs for 2 days, then 3 tabs for 2 days, 2 tabs for 2 days, then 1 tab by mouth daily for 2 days 08/02/22  Yes Tanda Rockers A, DO  pseudoephedrine (SUDAFED SINUS CONGESTION 12HR) 120 MG 12 hr tablet Take 1 tablet (120 mg total) by mouth 2 (two) times daily for 5 days. 08/02/22 08/07/22 Yes Tanda Rockers A, DO   fluticasone (FLONASE) 50 MCG/ACT nasal spray Place 1 spray into both nostrils in the morning and at bedtime for 7 days. 08/02/22 08/09/22  Sloan Leiter, DO                                                                                                                                    Past Surgical History History reviewed. No pertinent surgical history. Family History History reviewed. No pertinent family history.  Social History Social History   Tobacco Use   Smoking status: Never   Smokeless tobacco: Never  Vaping Use   Vaping Use: Never used  Substance Use Topics   Alcohol use: Not Currently    Comment: occ   Drug use: No   Allergies Bee venom and Lamictal [lamotrigine]  Review of Systems Review of Systems  Constitutional:  Negative for chills and fever.  HENT:  Positive  for congestion, sinus pressure and sinus pain. Negative for dental problem, ear pain and trouble swallowing.   Eyes:  Negative for photophobia and visual disturbance.  Respiratory:  Negative for cough and shortness of breath.   Cardiovascular:  Negative for chest pain and palpitations.  Gastrointestinal:  Negative for abdominal pain, nausea and vomiting.  Endocrine: Negative for polydipsia and polyuria.  Genitourinary:  Negative for difficulty urinating and hematuria.  Musculoskeletal:  Negative for gait problem and joint swelling.  Skin:  Negative for pallor and rash.  Neurological:  Negative for syncope and headaches.  Psychiatric/Behavioral:  Negative for agitation and confusion.     Physical Exam Vital Signs  I have reviewed the triage vital signs BP (!) 153/95 (BP Location: Right Arm)   Pulse 80   Temp 97.9 F (36.6 C) (Oral)   Resp 18   Ht 5\' 6"  (1.676 m)   Wt 79.4 kg   SpO2 100%   BMI 28.25 kg/m  Physical Exam Vitals and nursing note reviewed.  Constitutional:      General: He is not in acute distress.    Appearance: He is well-developed.  HENT:     Head: Normocephalic and  atraumatic. No raccoon eyes, Battle's sign, right periorbital erythema or left periorbital erythema.     Jaw: There is normal jaw occlusion. No trismus.     Comments: No drooling stridor or trismus     Right Ear: External ear normal.     Left Ear: External ear normal.     Nose:     Comments: Nasal polyp left No septal hematoma    Mouth/Throat:     Mouth: Mucous membranes are moist.     Pharynx: Oropharynx is clear. Uvula midline.  Eyes:     General: Vision grossly intact. Gaze aligned appropriately. No scleral icterus.    Extraocular Movements: Extraocular movements intact.     Conjunctiva/sclera:     Right eye: Right conjunctiva is not injected.     Left eye: Left conjunctiva is not injected.  Cardiovascular:     Rate and Rhythm: Normal rate and regular rhythm.     Pulses: Normal pulses.     Heart sounds: Normal heart sounds.  Pulmonary:     Effort: Pulmonary effort is normal. No tachypnea, accessory muscle usage or respiratory distress.     Breath sounds: Normal breath sounds.  Abdominal:     General: Abdomen is flat.     Palpations: Abdomen is soft.     Tenderness: There is no abdominal tenderness.  Musculoskeletal:     Cervical back: No rigidity. No pain with movement.     Right lower leg: No edema.     Left lower leg: No edema.  Lymphadenopathy:     Cervical: No cervical adenopathy.  Skin:    General: Skin is warm and dry.     Capillary Refill: Capillary refill takes less than 2 seconds.  Neurological:     Mental Status: He is alert and oriented to person, place, and time.  Psychiatric:        Mood and Affect: Mood normal.        Behavior: Behavior normal. Behavior is cooperative.     ED Results and Treatments Labs (all labs ordered are listed, but only abnormal results are displayed) Labs Reviewed - No data to display  Radiology No results  found.  Pertinent labs & imaging results that were available during my care of the patient were reviewed by me and considered in my medical decision making (see MDM for details).  Medications Ordered in ED Medications - No data to display                                                                                                                                   Procedures Procedures  (including critical care time)  Medical Decision Making / ED Course    Medical Decision Making:    Jonathan Brennan is a 32 y.o. male with past medical history as below, significant for seizure, recurrent sinus problems who presents to the ED with complaint of sinus pain/pressure. The complaint involves an extensive differential diagnosis and also carries with it a high risk of complications and morbidity.  Serious etiology was considered. Ddx includes but is not limited to: sinusitis,   Complete initial physical exam performed, notably the patient  was nad, no resp distress.    Reviewed and confirmed nursing documentation for past medical history, family history, social history.  Vital signs reviewed.      Pt here with recurrent sinusitis, sinus congestion. Nasal polyp/hypertrophy turbinate noted on left on exam. No bleeding. EOMI. No facial cellulitis, no drooling/stridor/trismus. Overall well appearing Trial outpatient management with nasal rinse, nasal / oral steroids, decongestant, nsaids/apap. F/u with ENT for further care   The patient improved significantly and was discharged in stable condition. Detailed discussions were had with the patient regarding current findings, and need for close f/u with PCP or on call doctor. The patient has been instructed to return immediately if the symptoms worsen in any way for re-evaluation. Patient verbalized understanding and is in agreement with current care plan. All questions answered prior to discharge.         Additional history  obtained: -Additional history obtained from na -External records from outside source obtained and reviewed including: Chart review including previous notes, labs, imaging, consultation notes including prior ed visits, labs/imaging   Lab Tests: na  EKG   EKG Interpretation  Date/Time:    Ventricular Rate:    PR Interval:    QRS Duration:   QT Interval:    QTC Calculation:   R Axis:     Text Interpretation:           Imaging Studies ordered: na   Medicines ordered and prescription drug management: Meds ordered this encounter  Medications   DISCONTD: fluticasone (FLONASE) 50 MCG/ACT nasal spray    Sig: Place 1 spray into both nostrils daily for 7 days.    Dispense:  15.8 mL    Refill:  0   predniSONE (STERAPRED UNI-PAK 21 TAB) 10 MG (21) TBPK tablet    Sig: Take by mouth daily. Take 6 tabs by mouth daily  for 2 days, then 5 tabs for 2 days,  then 4 tabs for 2 days, then 3 tabs for 2 days, 2 tabs for 2 days, then 1 tab by mouth daily for 2 days    Dispense:  42 tablet    Refill:  0   pseudoephedrine (SUDAFED SINUS CONGESTION 12HR) 120 MG 12 hr tablet    Sig: Take 1 tablet (120 mg total) by mouth 2 (two) times daily for 5 days.    Dispense:  10 tablet    Refill:  0   cetirizine (ZYRTEC ALLERGY) 10 MG tablet    Sig: Take 1 tablet (10 mg total) by mouth daily for 14 days.    Dispense:  14 tablet    Refill:  0   ibuprofen (ADVIL) 600 MG tablet    Sig: Take 1 tablet (600 mg total) by mouth every 6 (six) hours as needed.    Dispense:  30 tablet    Refill:  0   acetaminophen (TYLENOL) 325 MG tablet    Sig: Take 2 tablets (650 mg total) by mouth every 6 (six) hours as needed.    Dispense:  36 tablet    Refill:  0   fluticasone (FLONASE) 50 MCG/ACT nasal spray    Sig: Place 1 spray into both nostrils in the morning and at bedtime for 7 days.    Dispense:  15.8 mL    Refill:  0    -I have reviewed the patients home medicines and have made adjustments as  needed   Consultations Obtained: na   Cardiac Monitoring: Pulse oximetry continuous interpreted by me at 99-100% RA  Social Determinants of Health:  Diagnosis or treatment significantly limited by social determinants of health: no pcp   Reevaluation: After the interventions noted above, I reevaluated the patient and found that they have stayed the same  Co morbidities that complicate the patient evaluation  Past Medical History:  Diagnosis Date   Seizures (HCC)       Dispostion: Disposition decision including need for hospitalization was considered, and patient discharged from emergency department.    Final Clinical Impression(s) / ED Diagnoses Final diagnoses:  Sinus congestion  Hypertrophy, nasal, turbinate  Nasal polyp     This chart was dictated using voice recognition software.  Despite best efforts to proofread,  errors can occur which can change the documentation meaning.    Sloan Leiter, DO 08/02/22 [phone]

## 2022-08-02 NOTE — Discharge Instructions (Addendum)
Please follow-up with ENT/ear nose throat specialist in the next 2 weeks for recheck and consideration for further treatment of your nasal polyps.   It was a pleasure caring for you today in the emergency department.  Please return to the emergency department for any worsening or worrisome symptoms.

## 2022-08-02 NOTE — ED Triage Notes (Addendum)
Woke up with pressure/pain under left eye and in nose. C/o congestion. States he has been struggling with it for months but worsened this morning

## 2022-08-03 ENCOUNTER — Ambulatory Visit (INDEPENDENT_AMBULATORY_CARE_PROVIDER_SITE_OTHER): Payer: Self-pay | Admitting: Family Medicine

## 2022-08-03 ENCOUNTER — Encounter: Payer: Self-pay | Admitting: Family Medicine

## 2022-08-03 VITALS — BP 136/75 | HR 64 | Ht 66.0 in | Wt 183.0 lb

## 2022-08-03 DIAGNOSIS — Z0000 Encounter for general adult medical examination without abnormal findings: Secondary | ICD-10-CM

## 2022-08-03 DIAGNOSIS — Z91030 Bee allergy status: Secondary | ICD-10-CM

## 2022-08-03 DIAGNOSIS — J339 Nasal polyp, unspecified: Secondary | ICD-10-CM

## 2022-08-03 MED ORDER — EPINEPHRINE 0.3 MG/0.3ML IJ SOAJ
0.3000 mg | INTRAMUSCULAR | 3 refills | Status: AC | PRN
Start: 2022-08-03 — End: ?

## 2022-08-03 NOTE — Progress Notes (Signed)
New Patient Office Visit  Subjective    Patient ID: Jonathan Brennan, male    DOB: 1990-03-12  Age: 32 y.o. MRN: [id number]  CC:  Chief Complaint  Patient presents with   Establish Care    HPI Jonathan Brennan presents to establish care   Discussed the use of AI scribe software for clinical note transcription with the patient, who gave verbal consent to proceed.  History of Present Illness   The patient, with a history of childhood seizures previously managed with Depakote and Lamictal (he has been on anything since middle school and has had no seizures since childhood), presents with nasal polyps. He reports a year-long history of nasal congestion, loss of smell, and difficulty breathing through the nose. The patient describes a recent exacerbation of symptoms, with the onset of pain and swelling in the nose, and a sensation of 'burning, like water goes up your nose.' The pain has since resolved, but he continues to experience discomfort and difficulty breathing through the nose.  The patient has tried various treatments for the nasal polyps, including antibiotics and steroids, with no improvement. He has also used Flonase and Zyrtec for allergies, but stopped the Flonase when the nasal pain started.  The patient also reports a bee allergy, for which he does not currently have an EpiPen. 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            Outpatient Encounter Medications as of 08/03/2022  Medication Sig   acetaminophen (TYLENOL) 325 MG tablet Take 2 tablets (650 mg total) by mouth every 6 (six) hours as needed.   cetirizine (ZYRTEC ALLERGY) 10 MG tablet Take 1 tablet (10 mg total) by mouth daily for 14 days.   EPINEPHrine (EPIPEN 2-PAK) 0.3 mg/0.3 mL IJ SOAJ injection Inject 0.3 mg into the muscle as needed for anaphylaxis.   fluticasone (FLONASE) 50 MCG/ACT nasal spray Place 1 spray into both nostrils in the morning and at bedtime for 7 days.   ibuprofen  (ADVIL) 600 MG tablet Take 1 tablet (600 mg total) by mouth every 6 (six) hours as needed.   predniSONE (STERAPRED UNI-PAK 21 TAB) 10 MG (21) TBPK tablet Take by mouth daily. Take 6 tabs by mouth daily  for 2 days, then 5 tabs for 2 days, then 4 tabs for 2 days, then 3 tabs for 2 days, 2 tabs for 2 days, then 1 tab by mouth daily for 2 days (Patient not taking: Reported on 08/03/2022)   pseudoephedrine (SUDAFED SINUS CONGESTION 12HR) 120 MG 12 hr tablet Take 1 tablet (120 mg total) by mouth 2 (two) times daily for 5 days. (Patient not taking: Reported on 08/03/2022)   No facility-administered encounter medications on file as of 08/03/2022.    Past Medical History:  Diagnosis Date   Seizures (HCC)     History reviewed. No pertinent surgical history.  Family History  Problem Relation Age of Onset   Hypertension Father     Social History   Socioeconomic History   Marital status: Married    Spouse name: Not on file   Number of children: Not on file   Years of education: Not on file   Highest education level: Not on file  Occupational History   Not on file  Tobacco Use   Smoking status: Never   Smokeless tobacco: Never  Vaping Use   Vaping Use: Never used  Substance and Sexual Activity   Alcohol use: Not  Currently    Comment: occ   Drug use: No   Sexual activity: Yes    Partners: Female    Birth control/protection: Condom  Other Topics Concern   Not on file  Social History Narrative   Not on file   Social Determinants of Health   Financial Resource Strain: Not on file  Food Insecurity: Not on file  Transportation Needs: Not on file  Physical Activity: Not on file  Stress: Not on file  Social Connections: Not on file  Intimate Partner Violence: Not on file    ROS All review of systems negative except what is listed in the HPI      Objective    BP 136/75   Pulse 64   Ht 5\' 6"  (1.676 m)   Wt 183 lb (83 kg)   SpO2 100%   BMI 29.54 kg/m   Physical  Exam Vitals reviewed.  Constitutional:      Appearance: Normal appearance.  HENT:     Head: Normocephalic and atraumatic.     Right Ear: Tympanic membrane normal.     Left Ear: Tympanic membrane normal.     Nose: Congestion present.     Left Turbinates: Enlarged and swollen.  Cardiovascular:     Rate and Rhythm: Normal rate and regular rhythm.     Pulses: Normal pulses.     Heart sounds: Normal heart sounds.  Pulmonary:     Effort: Pulmonary effort is normal.     Breath sounds: Normal breath sounds.  Musculoskeletal:     Cervical back: Normal range of motion and neck supple.  Skin:    General: Skin is warm and dry.  Neurological:     Mental Status: He is alert and oriented to person, place, and time.  Psychiatric:        Mood and Affect: Mood normal.        Behavior: Behavior normal.        Thought Content: Thought content normal.        Judgment: Judgment normal.         Assessment & Plan:   Problem List Items Addressed This Visit     Bee sting allergy    EpiPen prescribed Safety discussed      Relevant Medications   EPINEPHrine (EPIPEN 2-PAK) 0.3 mg/0.3 mL IJ SOAJ injection   Nasal polyps    Significantly enalarged turbinates/polyps. He is prednisone available from recent ED visit, but hasn't taken because he has not noticed it helping in the past.  Continue Flonase and allergy regimen.  Referral to ENT Patient aware of signs/symptoms requiring further/urgent evaluation.       Relevant Orders   Ambulatory referral to ENT   Other Visit Diagnoses     Encounter for medical examination to establish care    -  Primary       Return for physical with labs later this year at your convenience .   Clayborne Dana, NP

## 2022-08-03 NOTE — Assessment & Plan Note (Signed)
EpiPen prescribed Safety discussed

## 2022-08-03 NOTE — Patient Instructions (Addendum)
ENT referral placed - they will be calling you to schedule. If you call around and find someone who can see you sooner and need [place] to change the referral let us know.     Thank you for choosing [facility] at [facility] for your Primary Care needs. I am excited for the opportunity to partner with you to meet your health care goals. It was a pleasure meeting you today!  Information on diet, exercise, and health maintenance recommendations are listed below. This is information to help you be sure you are on track for optimal health and monitoring.   Please look over this and let us know if you have any questions or if you have completed any of the health maintenance outside of [facility] so that we can be sure your records are up to date.  ___________________________________________________________  MyChart:  For all urgent or time sensitive needs we ask that you please call the office to avoid delays. Our number is [phone]. MyChart is not constantly monitored and due to the large volume of messages a day, replies may take up to 72 business hours.  MyChart Policy: MyChart allows for you to see your visit notes, after visit summary, provider recommendations, lab and tests results, make an appointment, request refills, and contact your provider or the office for non-urgent questions or concerns. Providers are seeing patients during normal business hours and do not have built in time to review MyChart messages.  We ask that you allow a minimum of 3 business days for responses to [employer]. For this reason, please do not send urgent requests through MyChart. Please call the office at [phone]. New and ongoing conditions may require a visit. We have virtual and in-person visits available for your convenience.  Complex MyChart concerns may require a visit. Your provider may request you schedule a virtual or in-person visit to ensure we are providing the best care  possible. MyChart messages sent after 11:00 AM on Friday will not be received by the provider until Monday morning.    Lab and Test Results: You will receive your lab and test results on MyChart as soon as they are completed and results have been sent by the lab or testing facility. Due to this service, you will receive your results BEFORE your provider.  I review lab and test results each morning prior to seeing patients. Some results require collaboration with other providers to ensure you are receiving the most appropriate care. For this reason, we ask that you please allow a minimum of 3-5 business days from the time that ALL results have been received for your provider to receive and review lab and test results and contact you about these.  Most lab and test result comments from the provider will be sent through MyChart. Your provider may recommend changes to the plan of care, follow-up visits, repeat testing, ask questions, or request an office visit to discuss these results. You may reply directly to this message or call the office to provide information for the provider or set up an appointment. In some instances, you will be called with test results and recommendations. Please let us know if this is preferred and we will make note of this in your chart to provide this for you.    If you have not heard a response to your lab or test results in 5 business days from all results returning to MyChart, please call the office to let us know. We  ask that you please avoid calling prior to this time unless there is an emergent concern. Due to high call volumes, this can delay the resulting process.  After Hours: For all non-emergency after hours needs, please call the office at [phone] and select the option to reach the on-call  service. On-call services are shared between multiple [place] offices and therefore it will not be possible to speak directly with your provider. On-call providers may  provide medical advice and recommendations, but are unable to provide refills for maintenance medications.  For all emergency or urgent medical needs after normal business hours, we recommend that you seek care at the closest Urgent Care or Emergency Department to ensure appropriate treatment in a timely manner.  [facility] has a 24 hour emergency room located on the ground floor for your convenience.   Urgent Concerns During the Business Day Providers are seeing patients from 8AM to 5PM with a busy schedule and are most often not able to respond to non-urgent calls until the end of the day or the next business day. If you should have URGENT concerns during the day, please call and speak to the nurse or schedule a same day appointment so that we can address your concern without delay.   Thank you, again, for choosing me as your health care partner. I appreciate your trust and look forward to learning more about you!   Lollie Marrow Reola Calkins, DNP, FNP-C  ___________________________________________________________  Health Maintenance Recommendations Screening Testing Mammogram Every 1-2 years based on history and risk factors Starting at age 8 Pap Smear Ages 21-39 every 3 years Ages 70-65 every 5 years with HPV testing More frequent testing may be required based on results and history Colon Cancer Screening Every 1-10 years based on test performed, risk factors, and history Starting at age 35 Bone Density Screening Every 2-10 years based on history Starting at age 3 for women Recommendations for men differ based on medication usage, history, and risk factors AAA Screening One time ultrasound Men 45-23 years old who have ever smoked Lung Cancer Screening Low Dose Lung CT every 12 months Age 36-80 years with a 20 pack-year smoking history who still smoke or who have quit within the last 15 years  Screening Labs Routine  Labs: Complete Blood Count (CBC), Complete Metabolic Panel  (CMP), Cholesterol (Lipid Panel) Every 6-12 months based on history and medications May be recommended more frequently based on current conditions or previous results Hemoglobin A1c Lab Every 3-12 months based on history and previous results Starting at age 68 or earlier with diagnosis of diabetes, high cholesterol, BMI >26, and/or risk factors Frequent monitoring for patients with diabetes to ensure blood sugar control Thyroid Panel  Every 6 months based on history, symptoms, and risk factors May be repeated more often if on medication HIV One time testing for all patients 75 and older May be repeated more frequently for patients with increased risk factors or exposure Hepatitis C One time testing for all patients 48 and older May be repeated more frequently for patients with increased risk factors or exposure Gonorrhea, Chlamydia Every 12 months for all sexually active persons 13-24 years Additional monitoring may be recommended for those who are considered high risk or who have symptoms PSA Men 64-70 years old with risk factors Additional screening may be recommended from age 27-69 based on risk factors, symptoms, and history  Vaccine Recommendations Tetanus Booster All adults every 10 years Flu Vaccine All patients 6 months and  older every year COVID Vaccine All patients 12 years and older Initial dosing with booster May recommend additional booster based on age and health history HPV Vaccine 2 doses all patients age 50-26 Dosing may be considered for patients over 26 Shingles Vaccine (Shingrix) 2 doses all adults 50 years and older Pneumonia (Pneumovax 23) All adults 65 years and older May recommend earlier dosing based on health history Pneumonia (Prevnar 52) All adults 65 years and older Dosed 1 year after Pneumovax 23 Pneumonia (Prevnar 20) All adults 65 years and older (adults 19-64 with certain conditions or risk factors) 1 dose  For those who have not received  Prevnar 13 vaccine previously   Additional Screening, Testing, and Vaccinations may be recommended on an individualized basis based on family history, health history, risk factors, and/or exposure.  __________________________________________________________  Diet Recommendations for All Patients  I recommend that all patients maintain a diet low in saturated fats, carbohydrates, and cholesterol. While this can be challenging at first, it is not impossible and small changes can make big differences.  Things to try: Decreasing the amount of soda, sweet tea, and/or juice to one or less per day and replace with water While water is always the first choice, if you do not like water you may consider adding a water additive without sugar to improve the taste other sugar free drinks Replace potatoes with a brightly colored vegetable  Use healthy oils, such as canola oil or olive oil, instead of butter or hard margarine Limit your bread intake to two pieces or less a day Replace regular pasta with low carb pasta options Bake, broil, or grill foods instead of frying Monitor portion sizes  Eat smaller, more frequent meals throughout the day instead of large meals  An important thing to remember is, if you love foods that are not great for your health, you don't have to give them up completely. Instead, allow these foods to be a reward when you have done well. Allowing yourself to still have special treats every once in a while is a nice way to tell yourself thank you for working hard to keep yourself healthy.   Also remember that every day is a new day. If you have a bad day and "fall off the wagon", you can still climb right back up and keep moving along on your journey!  We have resources available to help you!  Some websites that may be helpful include: www.http://www.wall-moore.info/  Www.VeryWellFit.com _____________________________________________________________  Activity Recommendations for All  Patients  I recommend that all adults get at least 20 minutes of moderate physical activity that elevates your heart rate at least 5 days out of the week.  Some examples include: Walking or jogging at a pace that allows you to carry on a conversation Cycling (stationary bike or outdoors) Water aerobics Yoga Weight lifting Dancing If physical limitations prevent you from putting stress on your joints, exercise in a pool or seated in a chair are excellent options.  Do determine your MAXIMUM heart rate for activity: 220 - YOUR AGE = MAX Heart Rate   Remember! Do not push yourself too hard.  Start slowly and build up your pace, speed, weight, time in exercise, etc.  Allow your body to rest between exercise and get good sleep. You will need more water than normal when you are exerting yourself. Do not wait until you are thirsty to drink. Drink with a purpose of getting in at least 8, 8 ounce glasses of water a  day plus more depending on how much you exercise and sweat.    If you begin to develop dizziness, chest pain, abdominal pain, jaw pain, shortness of breath, headache, vision changes, lightheadedness, or other concerning symptoms, stop the activity and allow your body to rest. If your symptoms are severe, seek emergency evaluation immediately. If your symptoms are concerning, but not severe, please let us know so that we can recommend further evaluation.

## 2022-08-03 NOTE — Assessment & Plan Note (Signed)
Significantly enalarged turbinates/polyps. He is prednisone available from recent ED visit, but hasn't taken because he has not noticed it helping in the past.  Continue Flonase and allergy regimen.  Referral to ENT Patient aware of signs/symptoms requiring further/urgent evaluation.
